# Patient Record
Sex: Female | Born: 1950 | Race: White | Hispanic: No | Marital: Married | Smoking: Never smoker
Health system: Southern US, Community
[De-identification: ages and names within clinical notes are randomized; demographics above are authoritative.]

---

## 2008-05-06 ENCOUNTER — Ambulatory Visit: Payer: Self-pay | Admitting: Infectious Diseases

## 2008-05-06 ENCOUNTER — Ambulatory Visit: Payer: Self-pay | Admitting: Infectious Disease

## 2008-05-06 ENCOUNTER — Encounter (INDEPENDENT_AMBULATORY_CARE_PROVIDER_SITE_OTHER): Payer: Self-pay | Admitting: Internal Medicine

## 2008-05-06 ENCOUNTER — Inpatient Hospital Stay (HOSPITAL_COMMUNITY): Admission: AD | Admit: 2008-05-06 | Discharge: 2008-05-08 | Payer: Self-pay | Admitting: Infectious Disease

## 2008-05-06 DIAGNOSIS — G5600 Carpal tunnel syndrome, unspecified upper limb: Secondary | ICD-10-CM | POA: Insufficient documentation

## 2008-05-06 DIAGNOSIS — R21 Rash and other nonspecific skin eruption: Secondary | ICD-10-CM | POA: Insufficient documentation

## 2008-05-13 ENCOUNTER — Encounter: Payer: Self-pay | Admitting: Internal Medicine

## 2008-05-14 ENCOUNTER — Ambulatory Visit: Payer: Self-pay | Admitting: Internal Medicine

## 2009-08-01 IMAGING — CR DG CHEST 2V
2 series · 2 of 2 positions shown · non-contrast
Comparison: None

CLINICAL DATA: Adverse drug reaction

CHEST - 2 VIEW

[view not recorded (1 of 2)]
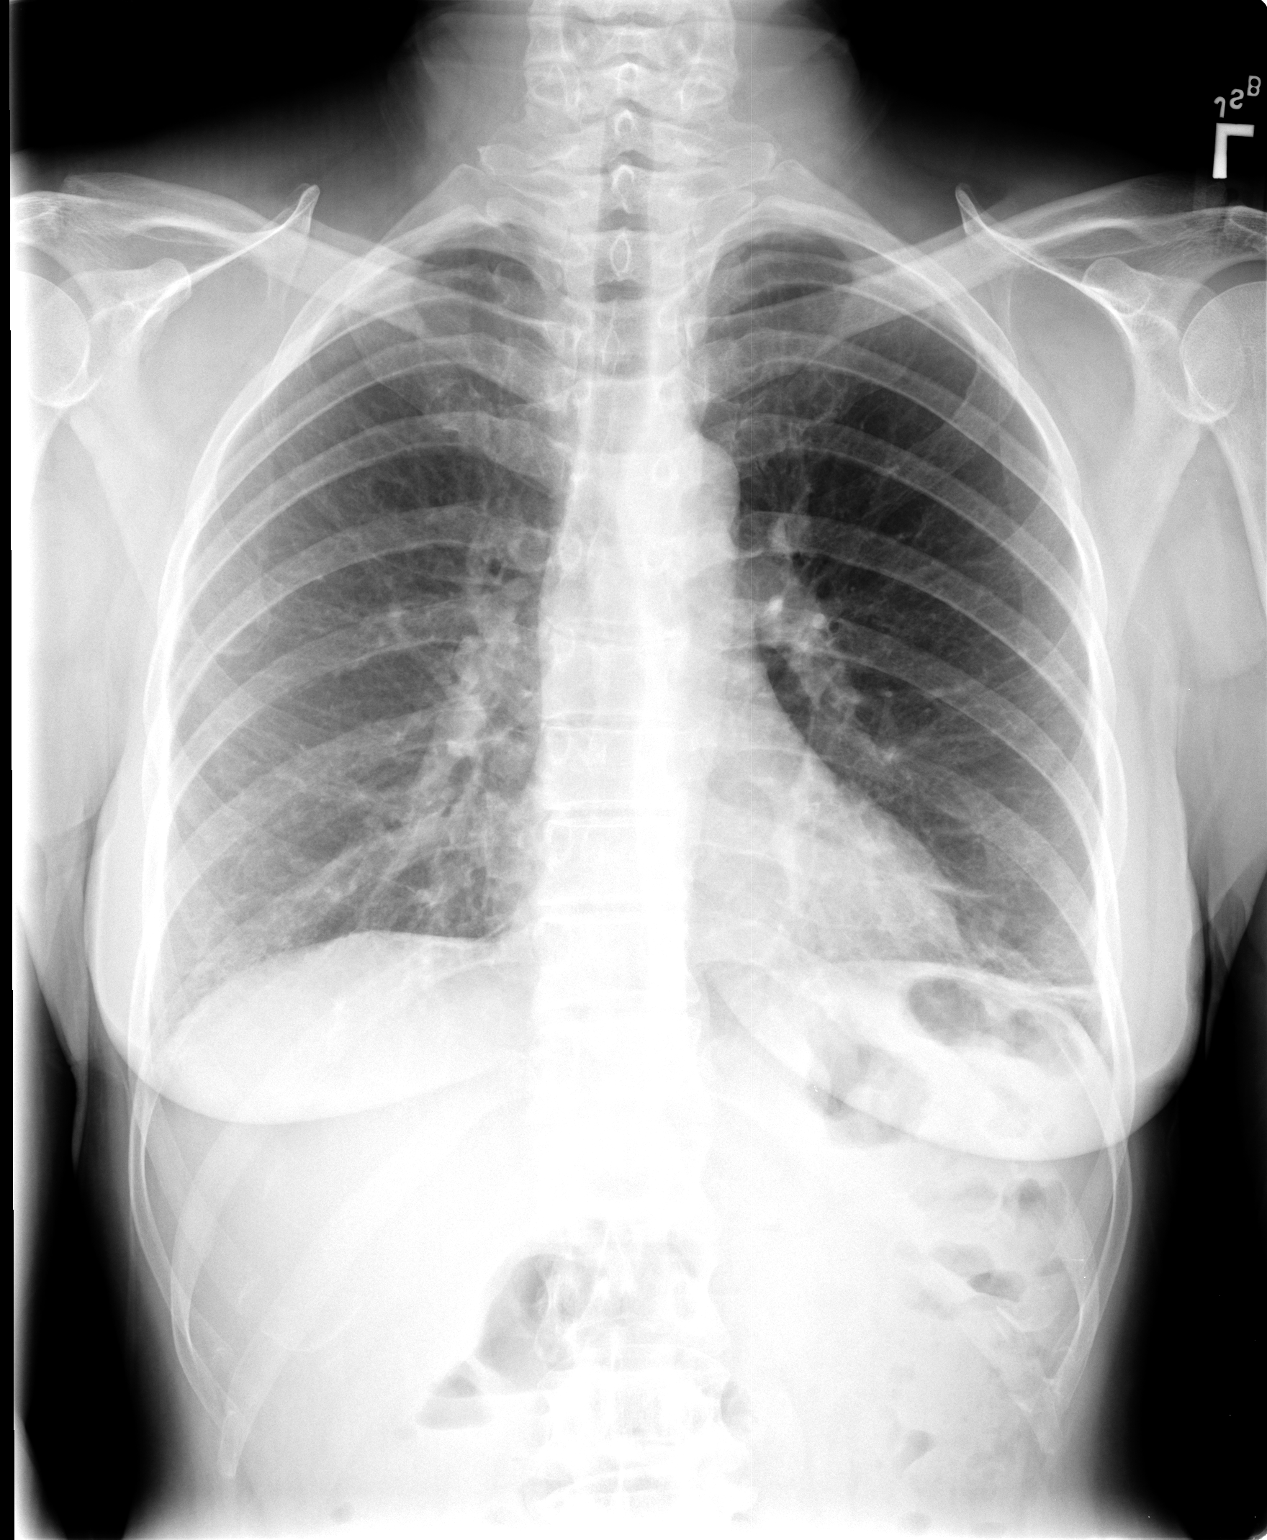

[view not recorded (2 of 2)]
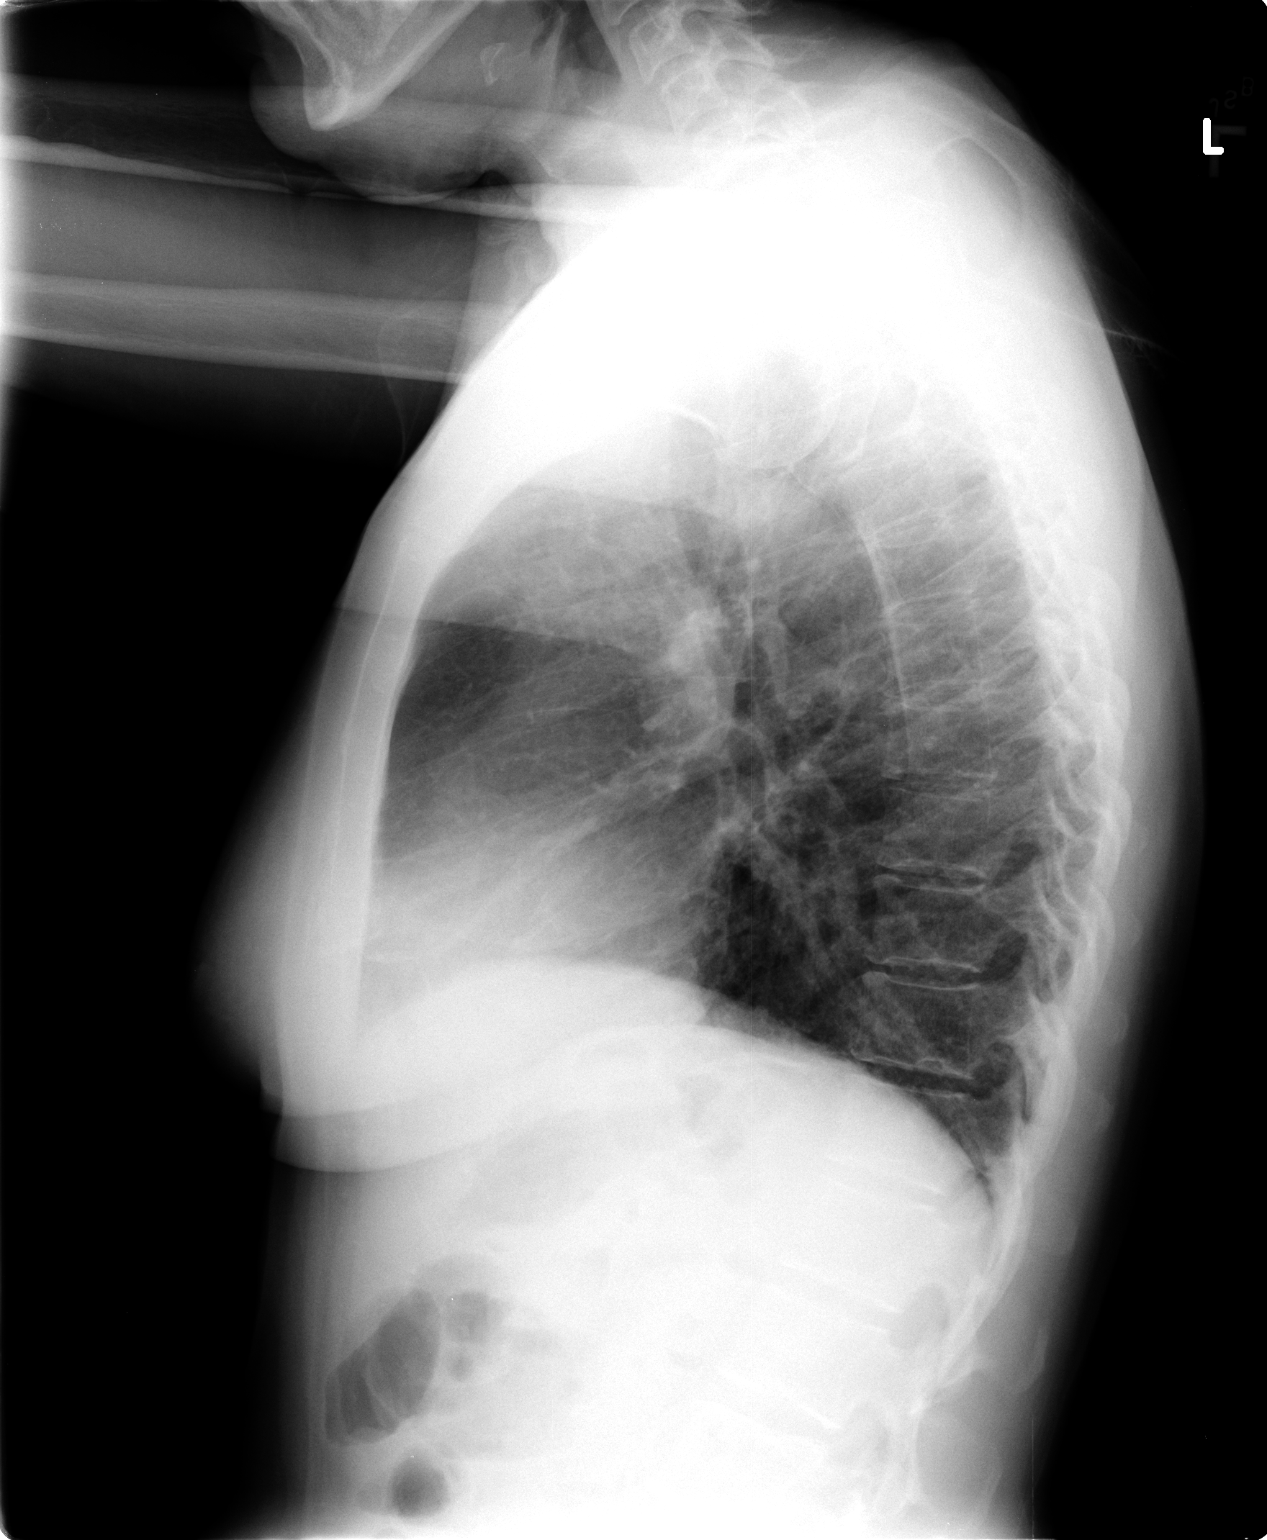

[2 of 2 positions shown; findings below may reference images not displayed]

FINDINGS: There is linear atelectasis at the lung bases, left
greater than right.  No focal infiltrate or effusion is seen.
Heart is within normal limits in size.  No bony abnormality is
noted.
IMPRESSION: Mild bibasilar linear atelectasis or scarring.  No infiltrate or
effusion.

REF:G1 DICTATED: 05/06/2008 [DATE]

## 2010-07-12 LAB — BASIC METABOLIC PANEL
BUN: 10 mg/dL (ref 6–23)
BUN: 11 mg/dL (ref 6–23)
CO2: 23 mEq/L (ref 19–32)
Chloride: 103 mEq/L (ref 96–112)
Chloride: 109 mEq/L (ref 96–112)
Glucose, Bld: 112 mg/dL — ABNORMAL HIGH (ref 70–99)
Potassium: 3.5 mEq/L (ref 3.5–5.1)
Potassium: 3.6 mEq/L (ref 3.5–5.1)

## 2010-07-12 LAB — DIFFERENTIAL
Basophils Absolute: 0 10*3/uL (ref 0.0–0.1)
Eosinophils Absolute: 0.1 10*3/uL (ref 0.0–0.7)
Eosinophils Absolute: 0.1 10*3/uL (ref 0.0–0.7)
Eosinophils Relative: 0 % (ref 0–5)
Eosinophils Relative: 1 % (ref 0–5)
Lymphocytes Relative: 12 % (ref 12–46)
Lymphs Abs: 2.2 10*3/uL (ref 0.7–4.0)
Monocytes Relative: 4 % (ref 3–12)
Neutrophils Relative %: 84 % — ABNORMAL HIGH (ref 43–77)

## 2010-07-12 LAB — CBC
HCT: 35.7 % — ABNORMAL LOW (ref 36.0–46.0)
HCT: 35.9 % — ABNORMAL LOW (ref 36.0–46.0)
HCT: 36.3 % (ref 36.0–46.0)
Hemoglobin: 12.5 g/dL (ref 12.0–15.0)
MCV: 87.6 fL (ref 78.0–100.0)
MCV: 88.1 fL (ref 78.0–100.0)
Platelets: 216 10*3/uL (ref 150–400)
Platelets: 280 10*3/uL (ref 150–400)
RBC: 4.15 MIL/uL (ref 3.87–5.11)
RDW: 12.5 % (ref 11.5–15.5)
WBC: 10 10*3/uL (ref 4.0–10.5)
WBC: 9.6 10*3/uL (ref 4.0–10.5)

## 2010-07-12 LAB — COMPREHENSIVE METABOLIC PANEL
ALT: 39 U/L — ABNORMAL HIGH (ref 0–35)
AST: 33 U/L (ref 0–37)
CO2: 26 mEq/L (ref 19–32)
Chloride: 100 mEq/L (ref 96–112)
Creatinine, Ser: 0.75 mg/dL (ref 0.4–1.2)
GFR calc Af Amer: >60 mL/min (ref >60)
GFR calc non Af Amer: >60 mL/min (ref >60)
Glucose, Bld: 104 mg/dL — ABNORMAL HIGH (ref 70–99)
Total Bilirubin: 0.8 mg/dL (ref 0.3–1.2)

## 2010-07-12 LAB — URINE MICROSCOPIC-ADD ON

## 2010-07-12 LAB — GLUCOSE, CAPILLARY
Glucose-Capillary: 147 mg/dL — ABNORMAL HIGH (ref 70–99)
Glucose-Capillary: 98 mg/dL (ref 70–99)

## 2010-07-12 LAB — URINALYSIS, ROUTINE W REFLEX MICROSCOPIC
Nitrite: NEGATIVE
Specific Gravity, Urine: 1.023 (ref 1.005–1.030)
Urobilinogen, UA: 0.2 mg/dL (ref 0.0–1.0)

## 2010-07-12 LAB — CULTURE, BLOOD (ROUTINE X 2)

## 2010-07-12 LAB — CARDIAC PANEL(CRET KIN+CKTOT+MB+TROPI)
CK, MB: 0.7 ng/mL (ref 0.3–4.0)
Relative Index: INVALID (ref 0.0–2.5)
Total CK: 32 U/L (ref 7–177)
Troponin I: 0.02 ng/mL (ref 0.00–0.06)
Troponin I: 0.02 ng/mL (ref 0.00–0.06)

## 2010-07-12 LAB — RHEUMATOID FACTOR: Rhuematoid fact SerPl-aCnc: <20 IU/mL (ref 0–20)

## 2010-07-12 LAB — RAPID URINE DRUG SCREEN, HOSP PERFORMED
Barbiturates: NOT DETECTED
Opiates: NOT DETECTED
Tetrahydrocannabinol: NOT DETECTED

## 2010-07-12 LAB — RUBEOLA ANTIBODY IGG: Rubeola IgG: 2.49 {ISR} — ABNORMAL HIGH

## 2010-07-12 LAB — MYCOPLASMA PNEUMONIAE ANTIBODY, IGM: Mycoplasma pneumo IgM: 97 Units/mL (ref <770)

## 2010-07-12 LAB — PROTIME-INR: Prothrombin Time: 16.5 seconds — ABNORMAL HIGH (ref 11.6–15.2)

## 2010-07-12 LAB — RUBEOLA ANTIBODY, IGM: Rubeola Antibodies, IgM: 0.24 AU (ref 0.00–0.79)

## 2010-07-12 LAB — ANTI-NUCLEAR AB-TITER (ANA TITER): ANA Titer 1: NEGATIVE

## 2010-07-12 LAB — URINE CULTURE: Colony Count: 25000

## 2010-08-02 ENCOUNTER — Encounter: Payer: Self-pay | Admitting: Internal Medicine

## 2010-08-09 NOTE — Discharge Summary (Signed)
NAME:  Grace Alvarez, Grace Alvarez                 ACCOUNT NO.:  [id number]   MEDICAL RECORD NO.:  [id number]          PATIENT TYPE:  INP   LOCATION:  5152                         FACILITY:  [facility]   PHYSICIAN:  Acey Lav, MD  DATE OF BIRTH:  July 09, 1950   DATE OF ADMISSION:  05/06/2008  DATE OF DISCHARGE:  05/08/2008                               DISCHARGE SUMMARY   DISCHARGE DIAGNOSES:  1. Fever and rash of unclear etiology, resolving.  2. Carpal tunnel syndrome, history of.  3. Shortness of breath, resolved with acute coronary syndrome ruled      out.   DISCHARGE MEDICATIONS:  1. Prednisone taper 60 mg p.o. x1 on day#1, decreasing by 10 mg per      day until course completed.  2. Benadryl 50 mg p.o. daily for 7 days.  3. Zantac 150 mg p.o. b.i.d. for 7 days.   DISPOSITION AND FOLLOW UP:  The patient was discharged home in stable  condition with no fevers for greater than 24 hours and progressive  improvement seen of her rash.  She is to follow up with Dr. Nicholas Lose at  [facility] on May 13, 2008 at 9:20.  At that time, the  results of the patient's punch biopsy performed by Dr. Nicholas Lose prior to  admission should be followed up on and the her rash should be reassessed  for continued improvement.  The patient is to also follow up with Dr.  Cena Benton at the [facility] on May 14, 2008 at 1:30  p.m.Marland Kitchen  At that time, the results of her pending laboratory workup for  her rash should be followed up on and the she should be asked for any  further episodes of fevers since her discharge.  The pending labs  include RPR, ASO titer, CRP, ESR, ferritin, measles antibodies, ANCA,  cold agglutinin, mycoplasma titers, and HIV titer.   PROCEDURES PERFORMED:  None.   CONSULTATIONS:  None.   BRIEF ADMITTING HISTORY AND PHYSICAL:  Ms. Cdebaca is a pleasant 60-year-  old female with no significant past medical history who developed a rash  6 days prior to admission which  started on her chest and lower neck area  and quickly spread across her entire body.  The patient went to her  dermatologist that same day who found her to have a positive strep  throat test as she was also complaining of a sore throat for several  days prior to the rash developing.  The patient was at that time treated  with a Z-Pak, Zyrtec, and triamcinolone cream.  The patient went to the  [place] [place] [facility] 2 days later complaining of some  shortness of breath and cough and she received some blood work including  an ESR that was elevated at 34 and a negative varicella zoster PCR but  no further treatment was given for her rash.  The patient said that she  felt subjective fevers and chills throughout this time and denies any  new medication or detergent or clothes prior to the rash appearing.  She  states that her  rash was getting a lot better until the a.m. of  admission when she re-erupted quickly and rapidly.  When she went back  to her dermatologist that day she was found to have a fever of 103  degrees and the patient was given one dose of oral doxycycline and punch  biopsies of two separate lesions on her arm were taken.  The patient was  then referred to [facility] for further  evaluation and management of her rash.  Because the patient was somewhat  toxic-appearing and febrile, it was decided that the patient would be  directly admitted from the outpatient clinic.  The patient states that  she continues to have chills, nonproductive cough, sore throat, malaise,  and some myalgias.  The patient denies any chest pain, palpitations,  headache, vision changes, photophobia, nausea, vomiting, diarrhea,  numbness or tingling, or itching.  Of note, the patient is allergic to  PENICILLIN which causes her hives and some shortness of breath but she  did not receive any of this prior to her rash appearing.   PHYSICAL EXAMINATION:  VITAL  SIGNS:  On admission, the patient had a  temperature of 101.8 degrees, blood pressure of 98/58, and pulse of 89.  GENERAL:  She was in mild distress, lethargic appearing.  EYE:  Extraocular motions to be intact.  Pupils equal, round, and  reactive to light.  Anicteric sclerae and clear conjunctiva.  ENT:  Oropharynx, oral cavity to have mild erythema but no exudate or  lesions were noted.  The patient also had moist mucous membranes and no  rash or vesicular type lesions on her mucous membranes.  NECK:  Supple and the patient had no lymphadenopathy, but there was some  slight tenderness to palpation on the left side of her neck close to the  angle of her mandible.  RESPIRATORY:  Decreased breath sounds bilaterally but was clear to  auscultation.  CARDIOVASCULAR:  Regular rate and rhythm with normal S1 and S2 and no  murmurs, rubs or gallops.  GI:  Positive bowel sounds and soft, nontender, nondistended abdomen and  no hepatosplenomegaly.  EXTREMITY:  No clubbing, cyanosis or edema.  SKIN:  Generalized eruption of urticarial appearing, but uniform 1-2 cm  maculopapular rash from the scalp to her toes.  There was relative  sparing of the palms and soles of feet however, there were occasional  lesions on both sides.  Many of the skin lesions had a hint of a scaly  center or possible resolving vesicular type lesion.  The margins of the  palms and dorsal feet also had a few pustular lesions containing what  appeared to be yellowish type fluid.  NEURO:  The patient to be alert and oriented x3.  Cranial nerves II-XII  were intact, 5/5 strength, normal sensation throughout and 2+ deep  tendon reflexes, and normal gait.   LABORATORY DATA ON ADMISSION:  A white blood cell count of 10.0,  hemoglobin of 12.5, platelets of 213, ANC of 8.4, MCV of 87.6, absolute  eosinophil count was within normal limits.  Sodium 137, potassium 3.8,  chloride 100, bicarb 26, BUN 9, creatinine 0.75, glucose 104,  total  bilirubin 0.8, alk phos 83, AST 38, ALT 39, total protein 7.0, albumin  3.4, calcium 8.9, PT 16.5, PTT 33, INR 1.3.  Urinalysis showed 0-2 red  blood cells, 7-10 white blood cells, negative nitrites, small bilirubin,  small ketones.   HOSPITAL COURSE BY PROBLEM:  1. Fever.  The  patient had a fever of 103 while in the dermatologist's      office prior to admission and on admission it was noted to be 101.8      degrees.  Infectious etiologies were first considered, however,      urine cultures and blood cultures were negative by the time of      discharge and the patient's chest x-ray showed no active lung      disease despite her complaining of a mild nonproductive cough prior      to admission.  The patient was initially continued on oral      doxycycline as her fever and rash was thought possibly to tick born      illness.  However, the patient's doxycycline was discontinued by      hospital day #2 and despite this, her temperatures remained normal      and her cough resolved soon afterwards.  Although the patient's      white blood cell count did increase by the day of discharge, this      was thought most likely a side effect of the prednisone that she      received while hospitalized for her rash.  By day of discharge, the      patient had been afebrile for greater than 24 hours and no clear      etiology of her fever was elucidated and there is question whether      or not it was related to her rash.  2. Rash.  A differential for this patient's rash was broad including      infectious, drug allergy, and rheumatological.  The patient did      have a skin biopsy performed by her dermatologist prior to      admission; however, the results of these were still pending by the      time of her discharge.  It is unlikely that this was related to her      positive strep throat as she seemed to have been adequately treated      and the presentation was not classical for scarlet fever  although      an ASO titer was obtained and was pending upon discharge. A viral      exanthme seemed unlikely given the biphasic nature of her rash,      though it is not out of the realm of possibility. The patient      denied any new medications or travel or insect bites prior to the      rash appearing and it was thought that contact dermatitis or      allergic reaction was less likely etiology for her rash.  Her urine      drug screen was also negative.  The patient's rheumatoid factor was      within normal limits and although she had a positive ANA Her titer      was negative.  However, autoimmune causes of her rash could not be      completely excluded and several other laboratory data including an      ANCA, ESR, CRP, and cold agglutininswere pending by the time of her      discharge.  The patient showed good response to oral prednisone,      and H1 and H2 blocker therapy.  Her rash showed progressive and      marked improvement by day of discharge, although it was still  present and when she gets followed up in the clinic, her skin      should be examined to assure complete resolution of her rash.  It      is still unclear as to the etiology of her skin lesion but she will      have close followup by her dermatologist upon discharge as well.  3. Shortness of breath and chest tightness.  On admission, the patient      was complaining of some mild shortness of breath and occasional      tight feeling of her chest without any other symptoms clinically      significant for acute coronary syndrome.  She did not have any      significant risk factors but her cardiac enzymes were cycled      nonetheless and an EKG was obtained which showed normal sinus      rhythm with no abnormalities and her cardiac enzymes were negative      as well.  By day of discharge, the patient had no more complaints      of either shortness of breath or chest tightness and no further      workup was  pursued. Should these symptoms return she will need      complete workup.   DISCHARGE LABORATORY DATA AND VITALS:  On day of discharge, the patient  had a temperature of 98.1, blood pressure of 116/74, pulse of 68,  respiratory rate of 18, oxygen saturation of 98% on room air.  White  blood cell count was 13.3, hemoglobin 12.3, platelets 280.  Sodium 139,  potassium 3.6, chloride 109, bicarb 23, BUN 11, creatinine 0.72, glucose  112, calcium 8.9.  Rheumatoid factor was negative.  Blood culture and  urine cultures remained negative.  ANA was positive but with a negative  titer.      Lucy Antigua, MD  Electronically Signed      Acey Lav, MD  Electronically Signed    RK/MEDQ  D:  05/08/2008  T:  05/09/2008  Job:  [id number]   cc:   Dr. Nicholas Lose  Dr. Cena Benton

## 2014-04-27 DIAGNOSIS — M5442 Lumbago with sciatica, left side: Secondary | ICD-10-CM | POA: Insufficient documentation

## 2022-09-06 ENCOUNTER — Other Ambulatory Visit: Payer: Self-pay

## 2022-09-12 ENCOUNTER — Other Ambulatory Visit: Payer: Self-pay

## 2022-09-13 ENCOUNTER — Ambulatory Visit: Payer: Medicare Other | Admitting: Gastroenterology

## 2022-09-13 ENCOUNTER — Encounter: Payer: Self-pay | Admitting: Gastroenterology

## 2022-09-13 VITALS — BP 131/73 | HR 64 | Temp 97.7°F | Ht 65.5 in | Wt 139.4 lb

## 2022-09-13 DIAGNOSIS — L290 Pruritus ani: Secondary | ICD-10-CM | POA: Diagnosis not present

## 2022-09-13 MED ORDER — CLOTRIMAZOLE 1 % EX CREA: 1.0000 | TOPICAL_CREAM | Freq: Two times a day (BID) | CUTANEOUS | 0 refills | Status: AC

## 2022-09-13 NOTE — Patient Instructions (Addendum)
Apply diaper rash cream once a day.

## 2022-09-13 NOTE — Progress Notes (Signed)
Arlyss Repress, MD [address]  [address]  [place], [place] [address]  Main: [phone]  Fax: [phone]    Gastroenterology Consultation  Referring Provider:     Clinton Sawyer, MD Primary Care Physician:  Clinton Sawyer, MD Primary Gastroenterologist:  Dr. Arlyss Repress Reason for Consultation: Perianal itching        HPI:   SHAWON LATCHFORD is a 72 y.o. female referred by Dr. Clinton Sawyer, MD  for consultation & management of perianal itching.  Patient reports that she has been experiencing severe nocturnal perianal itching that started in April.  She describes it as sudden onset of intense itching that wakes her up from sleep, feels like something is biting in her perianal area.  She was empirically treated for pain warm with mebendazole with no relief.  She had ova and parasite exam which was negative.  She applied lemon oil in the area which burned her skin.  She is desperate to get this resolved.  She was closely with her cat tail.  Reports that she takes showers 3 times daily.  She reports mucousy stool, denies any constipation or diarrhea.  She denies any abdominal pain, nausea or vomiting She does not smoke or drink alcohol  NSAIDs: None  Antiplts/Anticoagulants/Anti thrombotics: None  GI Procedures:  Colonoscopy in [place] in May, reportedly normal  History reviewed. No pertinent past medical history.  History reviewed. No pertinent surgical history.   Current Outpatient Medications:    ascorbic acid (VITAMIN C) 100 MG tablet, Take by mouth., Disp: , Rfl:    cefdinir (OMNICEF) 300 MG capsule, Take 300 mg by mouth 2 (two) times daily., Disp: , Rfl:    clotrimazole (LOTRIMIN) 1 % cream, Apply 1 Application topically 2 (two) times daily., Disp: 30 g, Rfl: 0   History reviewed. No pertinent family history.   Social History   Tobacco Use   Smoking status: Never  Substance Use Topics   Alcohol use: No   Drug use: No    Allergies as of 09/13/2022  - Review Complete 09/13/2022  Allergen Reaction Noted   Azithromycin  05/14/2008   Celecoxib Other (See Comments) 02/20/2022   Codeine Other (See Comments) 02/20/2022   Penicillins  05/06/2008    Review of Systems:    All systems reviewed and negative except where noted in HPI.   Physical Exam:  BP 131/73 (BP Location: Left Arm, Patient Position: Sitting, Cuff Size: Normal)   Pulse 64   Temp 97.7 F (36.5 C) (Oral)   Ht 5' 5.5" (1.664 m)   Wt 139 lb 6 oz (63.2 kg)   BMI 22.84 kg/m  No LMP recorded.  General:   Alert,  Well-developed, well-nourished, pleasant and cooperative in NAD Head:  Normocephalic and atraumatic. Eyes:  Sclera clear, no icterus.   Conjunctiva pink. Ears:  Normal auditory acuity. Nose:  No deformity, discharge, or lesions. Mouth:  No deformity or lesions,oropharynx pink & moist. Neck:  Supple; no masses or thyromegaly. Lungs:  Respirations even and unlabored.  Clear throughout to auscultation.   No wheezes, crackles, or rhonchi. No acute distress. Heart:  Regular rate and rhythm; no murmurs, clicks, rubs, or gallops. Abdomen:  Normal bowel sounds. Soft, non-tender and non-distended without masses, hepatosplenomegaly or hernias noted.  No guarding or rebound tenderness.   Rectal: Superficial, open wound in the perianal area.  Nontender digital rectal exam, there was no evidence of perianal erythema or dryness of the skin.  Skin appears moist. Msk:  Symmetrical without gross deformities. Good, equal movement & strength bilaterally. Pulses:  Normal pulses noted. Extremities:  No clubbing or edema.  No cyanosis. Neurologic:  Alert and oriented x3;  grossly normal neurologically. Skin:  Intact without significant lesions or rashes. No jaundice. Psych:  Alert and cooperative. Normal mood and affect.  Imaging Studies: No abdominal imaging  Assessment and Plan:   MORIREOLUWA HOLLIE is a 71 y.o. pleasant female with no significant past medical history is seen in  consultation for approximately 3 months history of intense nocturnal perianal pruritus without any change in bowel habits  Discussed about application of diaper rash cream Avoid using acidic/astringent agents in the perianal area Trial of clotrimazole cream twice daily Recommend pin worm Scotch tape test   Follow up based on the above workup   Arlyss Repress, MD

## 2022-09-18 ENCOUNTER — Telehealth: Payer: Self-pay | Admitting: Physician Assistant

## 2022-09-18 LAB — PINWORM PREP

## 2022-09-18 NOTE — Telephone Encounter (Signed)
Patient stated that she believe she got the wrong test. Patient stated that you advised her that she needed a test through [facility] require a stool simple. She said that she checked her result this morning [facility] said test was not perform because of incorrect simple a slide is needed. A good call back is [phone].

## 2022-09-19 NOTE — Telephone Encounter (Addendum)
[place] with [facility] states that her manager told her wrong how to collect the test she states that when she called [facility] to find out what happen that the patient has to dig out what she thinks are the warms and put them on the transform tape. And then put in the container and return to the lab.   Tried to call patient but the number in the chart that is primary is not in service. The home number and the patient answer and she states that she actually already got the test reorder and she redid the test because she saw a new GI doctor in [place]. She states they got her in my a cancellation. She states she will be seeing them now because it is closer for her. She states she wanted to see them first but they didn't have a appointment till November. She states the have reorder the test.
